# Patient Record
Sex: Male | Born: 1985 | Hispanic: Yes | Marital: Married | Smoking: Never smoker
Health system: Southern US, Community
[De-identification: ages and names within clinical notes are randomized; demographics above are authoritative.]

## PROBLEM LIST (undated history)

## (undated) HISTORY — PX: ANKLE SURGERY: SHX546

## (undated) HISTORY — PX: HAND SURGERY: SHX662

---
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
## 2007-09-28 ENCOUNTER — Emergency Department: Payer: Self-pay | Admitting: Emergency Medicine

## 2012-09-15 ENCOUNTER — Emergency Department: Payer: Self-pay | Admitting: Emergency Medicine

## 2012-09-15 LAB — URINALYSIS, COMPLETE
Blood: NEGATIVE
Glucose,UR: NEGATIVE mg/dL (ref 0–75)
Ketone: NEGATIVE
Leukocyte Esterase: NEGATIVE
Nitrite: NEGATIVE
Ph: 7 (ref 4.5–8.0)
RBC,UR: 1 /HPF (ref 0–5)
Specific Gravity: 1.016 (ref 1.003–1.030)
WBC UR: <1 /HPF (ref 0–5)

## 2012-09-15 LAB — PROTIME-INR: INR: 1

## 2012-09-15 LAB — LIPASE, BLOOD: Lipase: 95 U/L (ref 73–393)

## 2012-09-15 LAB — COMPREHENSIVE METABOLIC PANEL
Albumin: 3.8 g/dL (ref 3.4–5.0)
BUN: 17 mg/dL (ref 7–18)
Bilirubin,Total: 0.7 mg/dL (ref 0.2–1.0)
Chloride: 105 mmol/L (ref 98–107)
Co2: 30 mmol/L (ref 21–32)
Creatinine: 1.45 mg/dL — ABNORMAL HIGH (ref 0.60–1.30)
EGFR (African American): >60
Glucose: 95 mg/dL (ref 65–99)
Osmolality: 275 (ref 275–301)
SGOT(AST): 38 U/L — ABNORMAL HIGH (ref 15–37)
SGPT (ALT): 65 U/L (ref 12–78)
Sodium: 137 mmol/L (ref 136–145)
Total Protein: 7.8 g/dL (ref 6.4–8.2)

## 2012-09-15 LAB — CBC
HGB: 14.5 g/dL (ref 13.0–18.0)
MCV: 86 fL (ref 80–100)
RBC: 4.99 10*6/uL (ref 4.40–5.90)
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
## 2014-01-26 ENCOUNTER — Other Ambulatory Visit: Payer: Self-pay | Admitting: *Deleted

## 2014-01-26 ENCOUNTER — Encounter: Payer: Self-pay | Admitting: Podiatry

## 2014-01-26 ENCOUNTER — Ambulatory Visit (INDEPENDENT_AMBULATORY_CARE_PROVIDER_SITE_OTHER): Payer: Managed Care, Other (non HMO) | Admitting: Podiatry

## 2014-01-26 VITALS — BP 143/87 | HR 79 | Temp 98.4°F | Resp 16 | Ht 65.0 in | Wt 230.0 lb

## 2014-01-26 DIAGNOSIS — L259 Unspecified contact dermatitis, unspecified cause: Secondary | ICD-10-CM

## 2014-01-26 DIAGNOSIS — L089 Local infection of the skin and subcutaneous tissue, unspecified: Secondary | ICD-10-CM

## 2014-01-26 DIAGNOSIS — S90829A Blister (nonthermal), unspecified foot, initial encounter: Principal | ICD-10-CM

## 2014-01-26 DIAGNOSIS — S90426A Blister (nonthermal), unspecified lesser toe(s), initial encounter: Principal | ICD-10-CM

## 2014-01-26 DIAGNOSIS — L600 Ingrowing nail: Secondary | ICD-10-CM

## 2014-01-26 MED ORDER — PREDNISONE 10 MG PO KIT: 1.0000 | PACK | Freq: Four times a day (QID) | ORAL | Status: DC

## 2014-01-26 MED ORDER — CEPHALEXIN 500 MG PO CAPS: 500.0000 mg | ORAL_CAPSULE | Freq: Three times a day (TID) | ORAL | Status: DC

## 2014-01-26 NOTE — Progress Notes (Signed)
Subjective:     Patient ID: Casey Rogers, male   DOB: [birth year], 28 y.o.   MRN: [id number]  Foot Pain   patient states that he started to develop some discoloration on the plantar aspect of his foot about a week ago and it became painful. He then had and irritation on the dorsum of the foot and then on the lateral side of the left lower leg that are also painful when pressed. Does not remember specific injury or bite. Also complains that his big toenail is thick and painful on his left big toe   Review of Systems  All other systems reviewed and are negative.      Objective:   Physical Exam  Nursing note and vitals reviewed. Constitutional: He is oriented to person, place, and time.  Cardiovascular: Intact distal pulses.   Musculoskeletal: Normal range of motion.  Neurological: He is oriented to person, place, and time.  Skin: Skin is warm.   neurovascular status found to be intact with normal range of motion subtalar midtarsal joint and normal muscle strength. There is a small area on the plantar aspect of the left heel and the left forefoot around the third and fourth metatarsals on top of the left midfoot and on the left lateral leg that are red and splotchy appearance and painful. There is no skin and it has been several small blisters which are not present at this time. Also has a severely thickened left hallux nail which is dystrophic and he cannot take care of it himself and he can't remove one time without success     Assessment:     Unusual skin condition of the left foot and lower leg that it is not appear to be ulcerated and a severely damaged left hallux nail    Plan:     H&P and conditions discussed. We're moving him up to a 10 mg dose pack and starting him on an antibiotic cephalexin as a precautionary measure for his skin disease and I am sending for blood work. He is in a take approximately 3 days off work to let this rest and he wants to get the nail fixed as he  will be off of work. I did explain the risk of the procedure to him and he wants to have it done now and today I explained will be done permanently. I infiltrated 60 mg I can Marcaine mixture and then after sterile prep remove the nail and toe toe exposed matrix and applied phenol 3 applications 30 seconds followed by alcohol lavaged and sterile dressing. Gave instructions on Epson salt soaks and reappoint and if he is not improved on his skin condition by Monday with the medications he will see a dermatologist and contact us

## 2014-01-26 NOTE — Progress Notes (Signed)
   Subjective:    Patient ID: Casey Rogers, male    DOB: Aug 01, 1985, 28 y.o.   MRN: [id number]  HPI Comments: Pain in the bottom of the left foot, can hardly put any pressure on it, the left heel and the plantar foot under the 3rd and 4th toes. It started about a week ago and has got worse.  The top of foot has irritation or a bite mark on it and also a rash going up the side of the leg. The top of foot drained a little bit . The bottom of foot is almost bruised looking spottish and purple looking . Went to urgent care and they put me on a steroid and told me to come see a podiatrist right away.  Foot Pain Associated symptoms include a rash.      Review of Systems  Skin: Positive for rash.  All other systems reviewed and are negative.      Objective:   Physical Exam        Assessment & Plan:

## 2014-01-27 LAB — CBC WITH DIFFERENTIAL/PLATELET
BASOS ABS: 0.1 10*3/uL (ref 0.0–0.2)
BASOS: 1 %
EOS ABS: 0.1 10*3/uL (ref 0.0–0.4)
Eos: 1 %
HCT: 44.4 % (ref 37.5–51.0)
Hemoglobin: 15.3 g/dL (ref 12.6–17.7)
IMMATURE GRANS (ABS): 0 10*3/uL (ref 0.0–0.1)
IMMATURE GRANULOCYTES: 0 %
Lymphocytes Absolute: 3.2 10*3/uL — ABNORMAL HIGH (ref 0.7–3.1)
Lymphs: 32 %
MCH: 29.7 pg (ref 26.6–33.0)
MCHC: 34.5 g/dL (ref 31.5–35.7)
MCV: 86 fL (ref 79–97)
MONOS ABS: 0.9 10*3/uL (ref 0.1–0.9)
Monocytes: 9 %
NEUTROS PCT: 57 %
Neutrophils Absolute: 5.6 10*3/uL (ref 1.4–7.0)
RBC: 5.16 x10E6/uL (ref 4.14–5.80)
RDW: 13.2 % (ref 12.3–15.4)
WBC: 9.9 10*3/uL (ref 3.4–10.8)

## 2014-01-30 LAB — BMP8+EGFR
BUN / CREAT RATIO: 19 (ref 8–19)
BUN: 16 mg/dL (ref 6–20)
CO2: 26 mmol/L (ref 18–29)
Calcium: 9.3 mg/dL (ref 8.7–10.2)
Chloride: 101 mmol/L (ref 97–108)
Creatinine, Ser: 0.85 mg/dL (ref 0.76–1.27)
GFR calc Af Amer: 139 mL/min/{1.73_m2} (ref >59)
GFR calc non Af Amer: 120 mL/min/{1.73_m2} (ref >59)
Glucose: 87 mg/dL (ref 65–99)
Potassium: 3.8 mmol/L (ref 3.5–5.2)
SODIUM: 142 mmol/L (ref 134–144)

## 2014-01-30 LAB — SPECIMEN STATUS REPORT

## 2014-07-19 ENCOUNTER — Emergency Department: Payer: Self-pay | Admitting: Emergency Medicine

## 2015-08-19 IMAGING — CR DG CHEST 2V
1 series · 2 of 2 positions shown · non-contrast
Comparison: None.

CLINICAL DATA: Acute onset of shortness of breath. Nausea and left
upper back pressure. Initial encounter.

EXAM:
CHEST  2 VIEW

[Series 1: dxr chest pa (or ap) and lateral · 0.14mm/px · 2 of 2 slices shown]
[im 1/2]
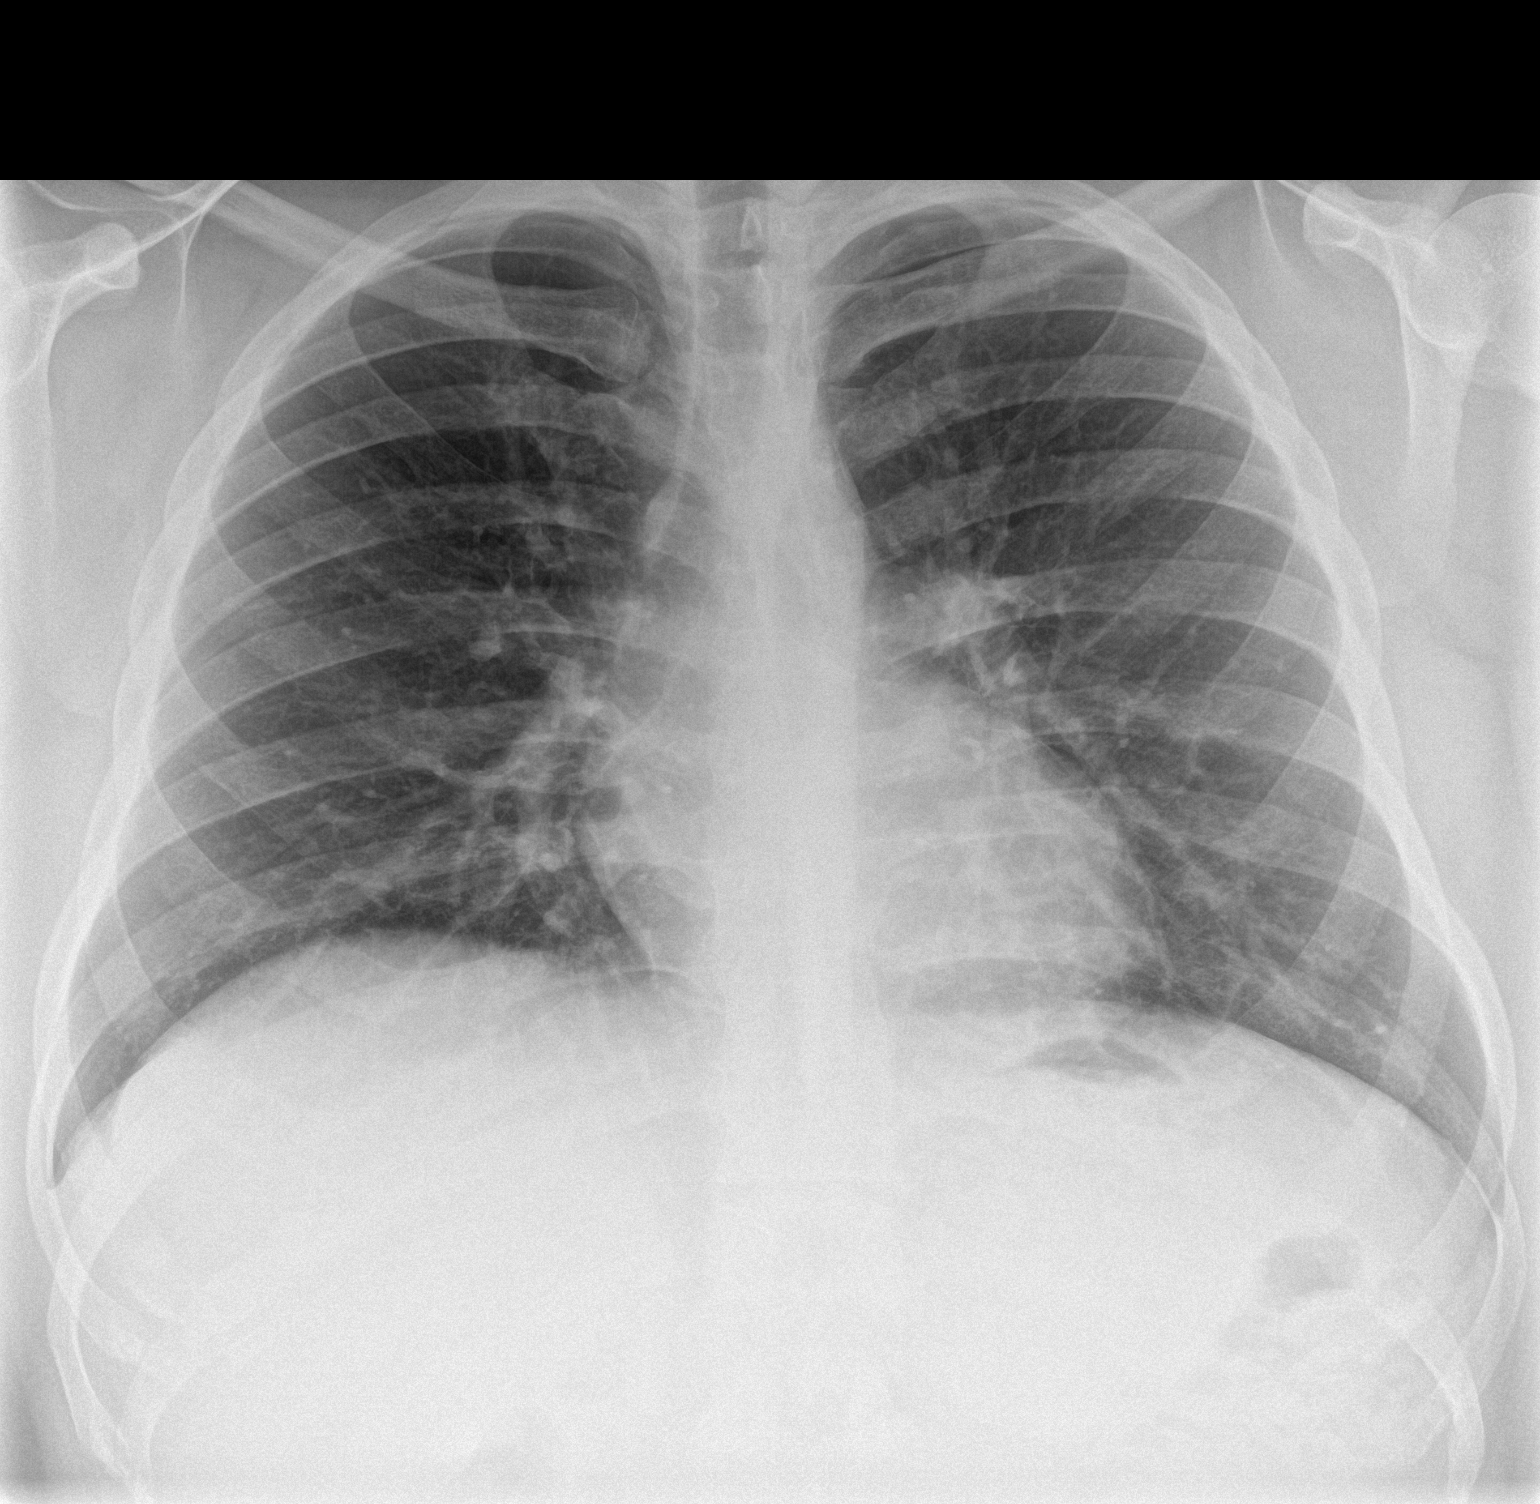
[im 2/2]
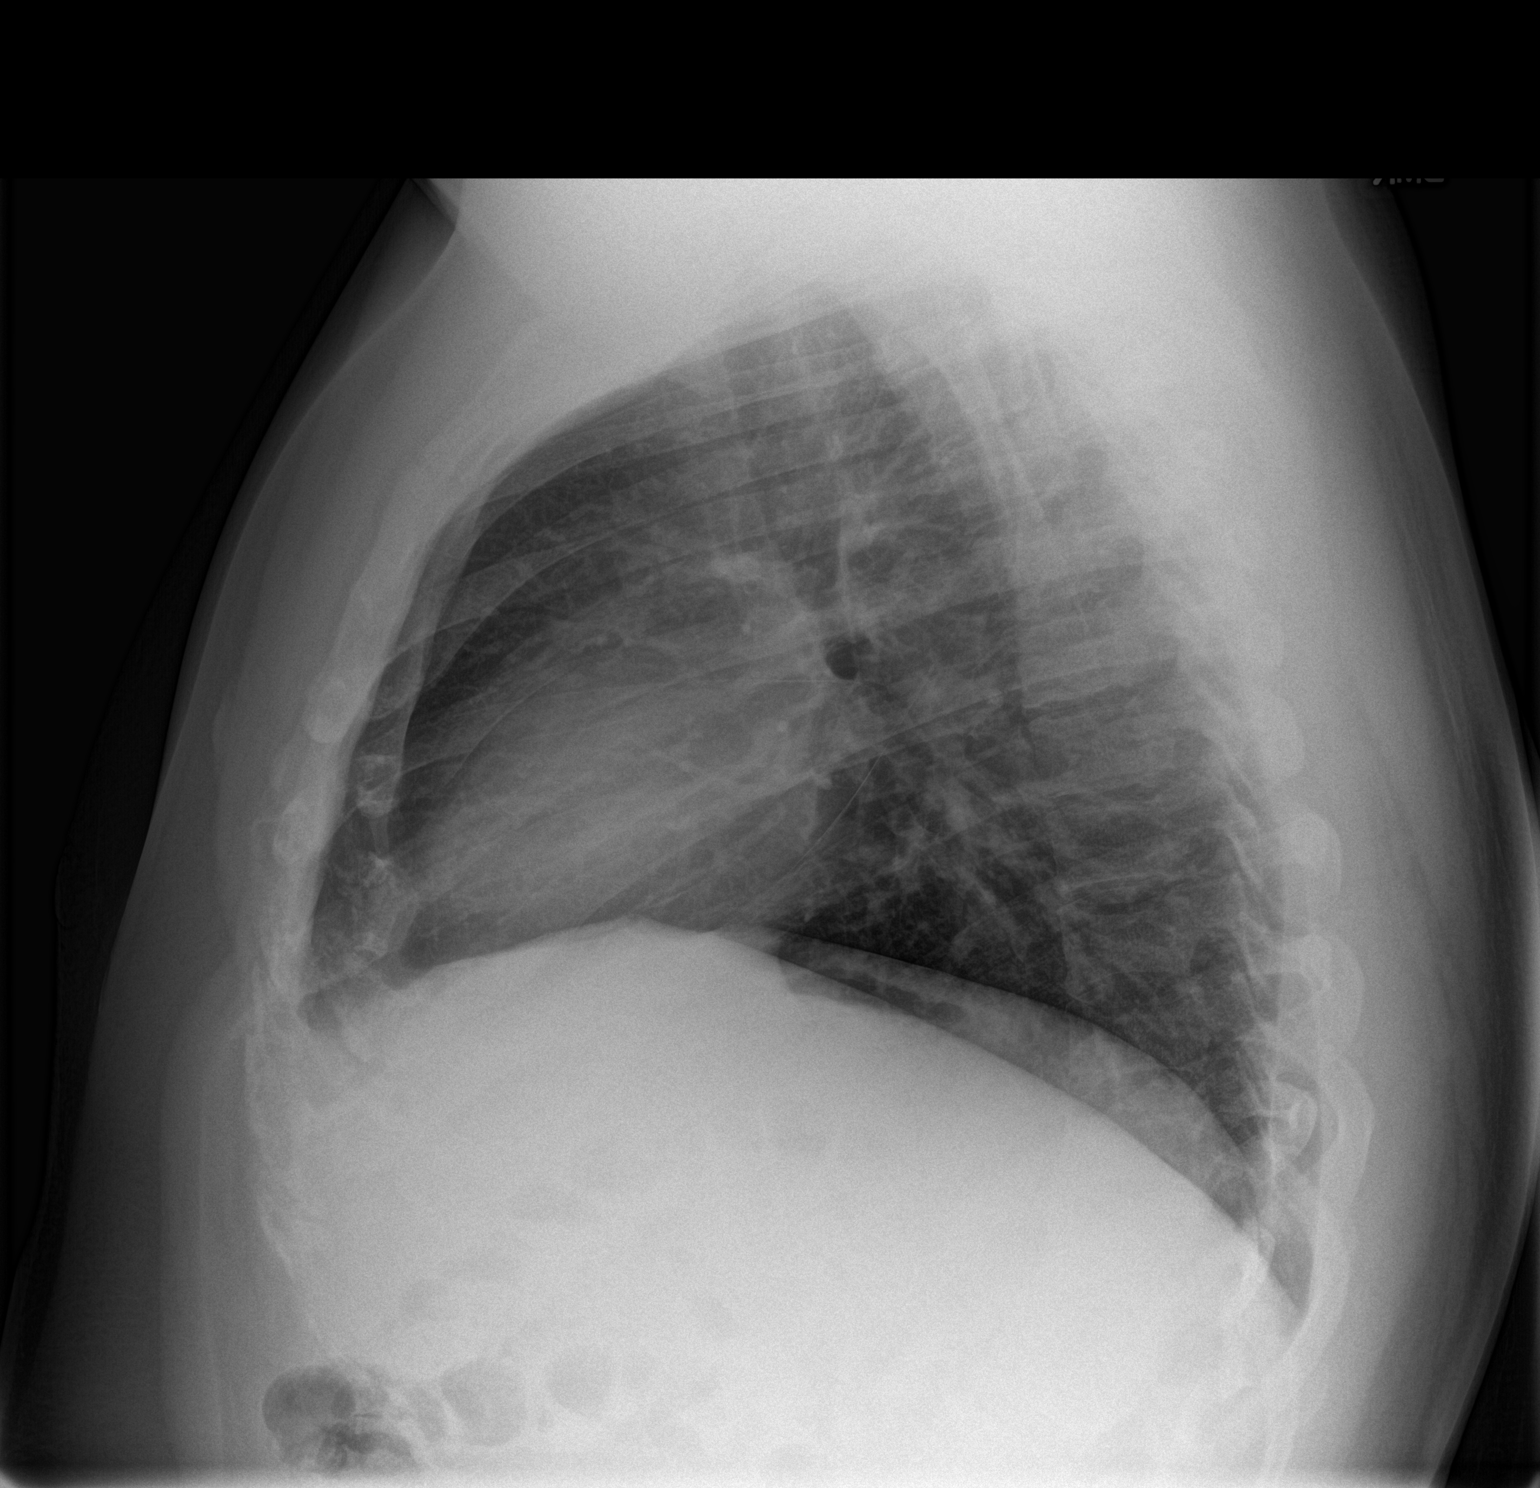

[2 of 2 positions shown; findings below may reference images not displayed]

FINDINGS: The lungs are well-aerated and clear. There is no evidence of focal
opacification, pleural effusion or pneumothorax.

The heart is normal in size; the mediastinal contour is within
normal limits. No acute osseous abnormalities are seen.
IMPRESSION: No acute cardiopulmonary process seen.

## 2019-02-07 ENCOUNTER — Ambulatory Visit (INDEPENDENT_AMBULATORY_CARE_PROVIDER_SITE_OTHER): Payer: PRIVATE HEALTH INSURANCE | Admitting: Physician Assistant

## 2019-02-07 ENCOUNTER — Encounter: Payer: Self-pay | Admitting: Physician Assistant

## 2019-02-07 ENCOUNTER — Other Ambulatory Visit: Payer: Self-pay | Admitting: Physician Assistant

## 2019-02-07 ENCOUNTER — Other Ambulatory Visit: Payer: Self-pay

## 2019-02-07 VITALS — BP 120/83 | HR 82 | Temp 97.3°F | Ht 65.0 in | Wt 206.0 lb

## 2019-02-07 DIAGNOSIS — E1165 Type 2 diabetes mellitus with hyperglycemia: Secondary | ICD-10-CM

## 2019-02-07 DIAGNOSIS — Z23 Encounter for immunization: Secondary | ICD-10-CM

## 2019-02-07 DIAGNOSIS — R634 Abnormal weight loss: Secondary | ICD-10-CM

## 2019-02-07 DIAGNOSIS — R631 Polydipsia: Secondary | ICD-10-CM | POA: Diagnosis not present

## 2019-02-07 DIAGNOSIS — Z6834 Body mass index (BMI) 34.0-34.9, adult: Secondary | ICD-10-CM

## 2019-02-07 DIAGNOSIS — Z114 Encounter for screening for human immunodeficiency virus [HIV]: Secondary | ICD-10-CM

## 2019-02-07 DIAGNOSIS — E6609 Other obesity due to excess calories: Secondary | ICD-10-CM | POA: Diagnosis not present

## 2019-02-07 NOTE — Progress Notes (Signed)
Patient: Casey Rogers, Male    DOB: [birth year], 33 y.o.   MRN: [id number] Visit Date: 02/07/2019  Today's Provider: Trinna Post, PA-C   Chief Complaint  Patient presents with  . Establish Care   Subjective:     Annual physical exam Casey Rogers is a 33 y.o. male who presents today for health maintenance and complete physical. He feels well. Pt reports having an unexplained  50 pound weight lost in two months time.  He reports it has stabilized as of now.  He also reports extreme thirst at the time of his weight loss.   He reports note exercising. He reports he is sleeping well.  Living in [place], wife of 32 years, son 50. Works as Land at [employer]. Never used drugs. Smoked remotely. Drinks alcohol once monthly.  Reports he experienced weight loss of 50 lbs over two months without doing anything different. Reports increased thirst. Reports increased urination. No night sweats, denies SOB. Reports weight 2 months ago was 250. Reports his Dad has prediabetes, brother who is in 80s with prediabetes. He is concerned because has been having abdominal pain as well and his grandmother had similar symptoms before being diagnosed with cancer.   Obesity:   BMI Readings from Last 5 Encounters:  02/07/19 34.28 kg/m  01/26/14 38.27 kg/m   Drinking 3 cans of cokes per day, drinks tropicana a cup in the morning, no sweet tea, no milkshakes Frequent rice, pasta, sandwhiches, burgers, buns, pizza Eats out three times per week: fast food burger king, subway, taco bell, [employer] cheese steak, [employer].  -----------------------------------------------------------------   Review of Systems  Constitutional: Positive for unexpected weight change (Pt reports he lost 50lbs in two months). Negative for activity change, appetite change, chills, diaphoresis, fatigue and fever.  HENT: Negative.   Eyes: Negative.   Respiratory: Negative.   Cardiovascular: Negative.    Gastrointestinal: Negative.   Endocrine: Positive for polydipsia. Negative for cold intolerance, heat intolerance, polyphagia and polyuria.  Genitourinary: Negative.   Musculoskeletal: Positive for back pain and myalgias. Negative for arthralgias, gait problem, joint swelling, neck pain and neck stiffness.  Skin: Negative.   Allergic/Immunologic: Negative for immunocompromised state.  Neurological: Negative.   Hematological: Negative.   Psychiatric/Behavioral: Negative.     Social History      He  reports that he has never smoked. He has never used smokeless tobacco. He reports current alcohol use. He reports that he does not use drugs.       Social History   Socioeconomic History  . Marital status: Married    Spouse name: Not on file  . Number of children: Not on file  . Years of education: Not on file  . Highest education level: Not on file  Occupational History  . Not on file  Social Needs  . Financial resource strain: Not on file  . Food insecurity    Worry: Not on file    Inability: Not on file  . Transportation needs    Medical: Not on file    Non-medical: Not on file  Tobacco Use  . Smoking status: Never Smoker  . Smokeless tobacco: Never Used  Substance and Sexual Activity  . Alcohol use: Yes  . Drug use: Never  . Sexual activity: Not on file  Lifestyle  . Physical activity    Days per week: Not on file    Minutes per session: Not on file  . Stress: Not on  file  Relationships  . Social Herbalist on phone: Not on file    Gets together: Not on file    Attends religious service: Not on file    Active member of club or organization: Not on file    Attends meetings of clubs or organizations: Not on file    Relationship status: Not on file  Other Topics Concern  . Not on file  Social History Narrative  . Not on file    No past medical history on file.   There are no active problems to display for this patient.   Past Surgical History:   Procedure Laterality Date  . ANKLE SURGERY    . HAND SURGERY      Family History        Family Status  Relation Name Status  . Mother  Alive  . Father  Alive  . Brother  Alive  . MGM  Deceased  . Brother  Alive        His family history includes Colon cancer in his maternal grandmother; Diabetes in his brother; Healthy in his mother; Hyperlipidemia in his brother.      No Known Allergies   Current Outpatient Medications:  .  cephALEXin (KEFLEX) 500 MG capsule, Take 1 capsule (500 mg total) by mouth 3 (three) times daily. (Patient not taking: Reported on 02/07/2019), Disp: 30 capsule, Rfl: 0 .  methylPREDNIsolone (MEDROL DOSPACK) 4 MG tablet, , Disp: , Rfl:  .  PredniSONE 10 MG KIT, Take 1 kit (10 mg total) by mouth taper from 4 doses each day to 1 dose and stop. (Patient not taking: Reported on 02/07/2019), Disp: 1 kit, Rfl: 0   Patient Care Team: Paulene Floor as PCP - General (Physician Assistant)    Objective:    Vitals: BP 120/83 (BP Location: Left Arm, Patient Position: Sitting, Cuff Size: Large)   Pulse 82   Temp (!) 97.3 F (36.3 C) (Temporal)   Ht 5' 5"  (1.651 m)   Wt 206 lb (93.4 kg)   BMI 34.28 kg/m    Vitals:   02/07/19 1511  BP: 120/83  Pulse: 82  Temp: (!) 97.3 F (36.3 C)  TempSrc: Temporal  Weight: 206 lb (93.4 kg)  Height: 5' 5"  (1.651 m)     Physical Exam Constitutional:      Appearance: Normal appearance. He is obese.  Cardiovascular:     Rate and Rhythm: Normal rate and regular rhythm.     Heart sounds: Normal heart sounds.  Pulmonary:     Effort: Pulmonary effort is normal.     Breath sounds: Normal breath sounds.  Abdominal:     General: Bowel sounds are normal.     Palpations: Abdomen is soft.  Skin:    General: Skin is warm and dry.  Neurological:     Mental Status: He is alert and oriented to person, place, and time. Mental status is at baseline.  Psychiatric:        Mood and Affect: Mood normal.        Behavior:  Behavior normal.      Depression Screen PHQ 2/9 Scores 02/07/2019  PHQ - 2 Score 0  PHQ- 9 Score 0       Assessment & Plan:     Routine Health Maintenance and Physical Exam  Exercise Activities and Dietary recommendations Goals   None      There is no immunization history on file for this patient.  Health Maintenance  Topic Date Due  . HIV Screening  08/18/2002  . TETANUS/TDAP  08/18/2006  . INFLUENZA VACCINE  12/03/2018     Discussed health benefits of physical activity, and encouraged him to engage in regular exercise appropriate for his age and condition.    1. Class 1 obesity due to excess calories without serious comorbidity with body mass index (BMI) of 34.0 to 34.9 in adult  - Comprehensive Metabolic Panel (CMET) - CBC with Differential - Lipid Profile - HgB A1c  2. Uncontrolled type 2 diabetes mellitus with hyperglycemia ([place])   3. Polydipsia  - HgB A1c  4. Weight loss  Suspect he has diabetes. Discussed in depth process related to weight loss and uncontrolled diabetes.  - Comprehensive Metabolic Panel (CMET) - CBC with Differential - TSH - HgB A1c  5. Encounter for screening for HIV  - HIV antibody (with reflex)  6. Need for influenza vaccination  - Flu Vaccine QUAD 6+ mos PF IM (Fluarix Quad PF)  I have spent 45 minutes with this patient, >50% of which was spent on counseling and coordination of care.  The entirety of the information documented in the History of Present Illness, Review of Systems and Physical Exam were personally obtained by me. Portions of this information were initially documented by Ashley Royalty, CMA and reviewed by me for thoroughness and accuracy.   --------------------------------------------------------------------    Trinna Post, PA-C  [facility]

## 2019-02-07 NOTE — Patient Instructions (Signed)

## 2019-02-08 ENCOUNTER — Telehealth: Payer: Self-pay

## 2019-02-08 DIAGNOSIS — E6609 Other obesity due to excess calories: Secondary | ICD-10-CM | POA: Insufficient documentation

## 2019-02-08 DIAGNOSIS — E1165 Type 2 diabetes mellitus with hyperglycemia: Secondary | ICD-10-CM | POA: Insufficient documentation

## 2019-02-08 DIAGNOSIS — Z6834 Body mass index (BMI) 34.0-34.9, adult: Secondary | ICD-10-CM | POA: Insufficient documentation

## 2019-02-08 LAB — CBC WITH DIFFERENTIAL/PLATELET
Basophils Absolute: 0 10*3/uL (ref 0.0–0.2)
Basos: 1 %
EOS (ABSOLUTE): 0.3 10*3/uL (ref 0.0–0.4)
Eos: 4 %
Hematocrit: 48.4 % (ref 37.5–51.0)
Hemoglobin: 16.2 g/dL (ref 13.0–17.7)
Immature Grans (Abs): 0 10*3/uL (ref 0.0–0.1)
Immature Granulocytes: 0 %
Lymphocytes Absolute: 2.1 10*3/uL (ref 0.7–3.1)
Lymphs: 33 %
MCH: 29.8 pg (ref 26.6–33.0)
MCHC: 33.5 g/dL (ref 31.5–35.7)
MCV: 89 fL (ref 79–97)
Monocytes Absolute: 0.3 10*3/uL (ref 0.1–0.9)
Monocytes: 5 %
Neutrophils Absolute: 3.6 10*3/uL (ref 1.4–7.0)
Neutrophils: 57 %
Platelets: 226 10*3/uL (ref 150–450)
RBC: 5.44 x10E6/uL (ref 4.14–5.80)
RDW: 12.5 % (ref 11.6–15.4)
WBC: 6.3 10*3/uL (ref 3.4–10.8)

## 2019-02-08 LAB — COMPREHENSIVE METABOLIC PANEL
ALT: 62 IU/L — ABNORMAL HIGH (ref 0–44)
AST: 27 IU/L (ref 0–40)
Albumin/Globulin Ratio: 1.3 (ref 1.2–2.2)
Albumin: 4.6 g/dL (ref 4.0–5.0)
Alkaline Phosphatase: 115 IU/L (ref 39–117)
BUN/Creatinine Ratio: 13 (ref 9–20)
BUN: 12 mg/dL (ref 6–20)
Bilirubin Total: 0.6 mg/dL (ref 0.0–1.2)
CO2: 23 mmol/L (ref 20–29)
Calcium: 9.6 mg/dL (ref 8.7–10.2)
Chloride: 97 mmol/L (ref 96–106)
Creatinine, Ser: 0.93 mg/dL (ref 0.76–1.27)
GFR calc Af Amer: 126 mL/min/{1.73_m2} (ref >59)
GFR calc non Af Amer: 109 mL/min/{1.73_m2} (ref >59)
Globulin, Total: 3.6 g/dL (ref 1.5–4.5)
Glucose: 368 mg/dL — ABNORMAL HIGH (ref 65–99)
Potassium: 4 mmol/L (ref 3.5–5.2)
Sodium: 135 mmol/L (ref 134–144)
Total Protein: 8.2 g/dL (ref 6.0–8.5)

## 2019-02-08 LAB — LIPID PANEL
Chol/HDL Ratio: 5.5 ratio — ABNORMAL HIGH (ref 0.0–5.0)
Cholesterol, Total: 186 mg/dL (ref 100–199)
HDL: 34 mg/dL — ABNORMAL LOW (ref >39)
LDL Chol Calc (NIH): 97 mg/dL (ref 0–99)
Triglycerides: 328 mg/dL — ABNORMAL HIGH (ref 0–149)
VLDL Cholesterol Cal: 55 mg/dL — ABNORMAL HIGH (ref 5–40)

## 2019-02-08 LAB — HIV ANTIBODY (ROUTINE TESTING W REFLEX): HIV Screen 4th Generation wRfx: NONREACTIVE

## 2019-02-08 LAB — HEMOGLOBIN A1C
Est. average glucose Bld gHb Est-mCnc: 352 mg/dL
Hgb A1c MFr Bld: 13.9 % — ABNORMAL HIGH (ref 4.8–5.6)

## 2019-02-08 LAB — TSH: TSH: 1.75 u[IU]/mL (ref 0.450–4.500)

## 2019-02-08 NOTE — Telephone Encounter (Signed)
-----   Message from Trinna Post, [place] sent at 02/08/2019  2:00 PM EDT ----- His A1c is 13.9% which is consistent with the severely uncontrolled diabetes that we discussed in the clinic. His cholesterol is elevated. One of his liver enzymes is also elevated, probably related to his weight. His thyroid, blood counts and HIV tests are normal. Please schedule him a 40 minute follow up so we can discuss diabetes and start treatment.

## 2019-02-08 NOTE — Telephone Encounter (Signed)
[id number] 

## 2019-02-08 NOTE — Telephone Encounter (Signed)
Advised and appointment made 

## 2019-02-14 ENCOUNTER — Encounter: Payer: Self-pay | Admitting: Physician Assistant

## 2019-02-14 ENCOUNTER — Other Ambulatory Visit: Payer: Self-pay

## 2019-02-14 ENCOUNTER — Ambulatory Visit: Payer: PRIVATE HEALTH INSURANCE | Admitting: Physician Assistant

## 2019-02-14 VITALS — BP 110/74 | HR 79 | Temp 97.5°F | Resp 16 | Ht 65.0 in | Wt 207.0 lb

## 2019-02-14 DIAGNOSIS — Z6834 Body mass index (BMI) 34.0-34.9, adult: Secondary | ICD-10-CM

## 2019-02-14 DIAGNOSIS — E1169 Type 2 diabetes mellitus with other specified complication: Secondary | ICD-10-CM

## 2019-02-14 DIAGNOSIS — E785 Hyperlipidemia, unspecified: Secondary | ICD-10-CM

## 2019-02-14 DIAGNOSIS — E1165 Type 2 diabetes mellitus with hyperglycemia: Secondary | ICD-10-CM | POA: Diagnosis not present

## 2019-02-14 DIAGNOSIS — E6609 Other obesity due to excess calories: Secondary | ICD-10-CM | POA: Diagnosis not present

## 2019-02-14 LAB — POCT UA - MICROALBUMIN: Microalbumin Ur, POC: 20 mg/L

## 2019-02-14 MED ORDER — METFORMIN HCL ER 500 MG PO TB24: ORAL_TABLET | ORAL | 0 refills | Status: AC

## 2019-02-14 NOTE — Progress Notes (Signed)
Patient: Casey Rogers Male    DOB: March 24, 1986   33 y.o.   MRN: [id number] Visit Date: 02/14/2019  Today's Provider: Trey Sailors, PA-C   Chief Complaint  Patient presents with  . Follow-up   Subjective:     HPI   Patient is here to discuss his lab results from 02/07/2019. Newly diagnosed with diabetes at initial visit.   Lab Results  Component Value Date   HGBA1C 13.9 (H) 02/07/2019   HLD  Lipid Panel     Component Value Date/Time   CHOL 186 02/07/2019 1553   TRIG 328 (H) 02/07/2019 1553   HDL 34 (L) 02/07/2019 1553   CHOLHDL 5.5 (H) 02/07/2019 1553   LDLCALC 97 02/07/2019 1553   LABVLDL 55 (H) 02/07/2019 1553     Wt Readings from Last 3 Encounters:  02/14/19 207 lb (93.9 kg)  02/07/19 206 lb (93.4 kg)  01/26/14 230 lb (104.3 kg)     No Known Allergies   Current Outpatient Medications:  .  metFORMIN (GLUCOPHAGE XR) 500 MG 24 hr tablet, Take 1 pill in the morning x 1 wk. Take 1 pill 2x daily for 1 wk. Take 2 pills in the morning and 1 at night x 1 wk. Then take 2 pills twice daily onward., Disp: 180 tablet, Rfl: 0  Review of Systems  Constitutional: Negative for appetite change, chills and fever.  Respiratory: Negative for chest tightness, shortness of breath and wheezing.   Cardiovascular: Negative for chest pain and palpitations.  Gastrointestinal: Negative for abdominal pain, nausea and vomiting.    Social History   Tobacco Use  . Smoking status: Never Smoker  . Smokeless tobacco: Never Used  Substance Use Topics  . Alcohol use: Yes      Objective:   BP 110/74 (BP Location: Left Arm, Patient Position: Sitting, Cuff Size: Large)   Pulse 79   Temp (!) 97.5 F (36.4 C) (Other (Comment))   Resp 16   Ht 5\' 5"  (1.651 m)   Wt 207 lb (93.9 kg)   SpO2 95%   BMI 34.45 kg/m  Vitals:   02/14/19 0823  BP: 110/74  Pulse: 79  Resp: 16  Temp: (!) 97.5 F (36.4 C)  TempSrc: Other (Comment)  SpO2: 95%  Weight: 207 lb (93.9 kg)    Height: 5\' 5"  (1.651 m)  Body mass index is 34.45 kg/m.   Physical Exam Constitutional:      Appearance: Normal appearance. He is obese.  Cardiovascular:     Rate and Rhythm: Normal rate.     Pulses: Normal pulses.  Skin:    General: Skin is warm and dry.  Neurological:     Mental Status: He is alert and oriented to person, place, and time. Mental status is at baseline.  Psychiatric:        Mood and Affect: Mood normal.        Behavior: Behavior normal.      Results for orders placed or performed in visit on 02/14/19  POCT UA - Microalbumin  Result Value Ref Range   Microalbumin Ur, POC 20 mg/L       Assessment & Plan    1. Uncontrolled type 2 diabetes mellitus with hyperglycemia (HCC)  Refer to nurse educator for use on meter and lifestyle interventions. Rx for meter given. Needs eye appointment. Pneumovax at next visit.   - Ambulatory referral to Ophthalmology - POCT UA - Microalbumin - Ambulatory referral to Chronic Care Management  Services - metFORMIN (GLUCOPHAGE XR) 500 MG 24 hr tablet; Take 1 pill in the morning x 1 wk. Take 1 pill 2x daily for 1 wk. Take 2 pills in the morning and 1 at night x 1 wk. Then take 2 pills twice daily onward.  Dispense: 180 tablet; Refill: 0  2. Hyperlipidemia associated with type 2 diabetes mellitus (HCC)  Elevated.  3. Class 1 obesity due to excess calories without serious comorbidity with body mass index (BMI) of 34.0 to 34.9 in adult  CCM referral.  The entirety of the information documented in the History of Present Illness, Review of Systems and Physical Exam were personally obtained by me. Portions of this information were initially documented by April M. Sabra Heck, CMA and reviewed by me for thoroughness and accuracy.   F/u 1 month DM II      Trinna Post, PA-C  [facility]

## 2019-02-14 NOTE — Patient Instructions (Signed)
Diabetes Mellitus and Exercise Exercising regularly is important for your overall health, especially when you have diabetes (diabetes mellitus). Exercising is not only about losing weight. It has many other health benefits, such as increasing muscle strength and bone density and reducing body fat and stress. This leads to improved fitness, flexibility, and endurance, all of which result in better overall health. Exercise has additional benefits for people with diabetes, including:  Reducing appetite.  Helping to lower and control blood glucose.  Lowering blood pressure.  Helping to control amounts of fatty substances (lipids) in the blood, such as cholesterol and triglycerides.  Helping the body to respond better to insulin (improving insulin sensitivity).  Reducing how much insulin the body needs.  Decreasing the risk for heart disease by: ? Lowering cholesterol and triglyceride levels. ? Increasing the levels of good cholesterol. ? Lowering blood glucose levels. What is my activity plan? Your health care provider or certified diabetes educator can help you make a plan for the type and frequency of exercise (activity plan) that works for you. Make sure that you:  Do at least 150 minutes of moderate-intensity or vigorous-intensity exercise each week. This could be brisk walking, biking, or water aerobics. ? Do stretching and strength exercises, such as yoga or weightlifting, at least 2 times a week. ? Spread out your activity over at least 3 days of the week.  Get some form of physical activity every day. ? Do not go more than 2 days in a row without some kind of physical activity. ? Avoid being inactive for more than 30 minutes at a time. Take frequent breaks to walk or stretch.  Choose a type of exercise or activity that you enjoy, and set realistic goals.  Start slowly, and gradually increase the intensity of your exercise over time. What do I need to know about managing my  diabetes?   Check your blood glucose before and after exercising. ? If your blood glucose is 240 mg/dL (13.3 mmol/L) or higher before you exercise, check your urine for ketones. If you have ketones in your urine, do not exercise until your blood glucose returns to normal. ? If your blood glucose is 100 mg/dL (5.6 mmol/L) or lower, eat a snack containing 15-20 grams of carbohydrate. Check your blood glucose 15 minutes after the snack to make sure that your level is above 100 mg/dL (5.6 mmol/L) before you start your exercise.  Know the symptoms of low blood glucose (hypoglycemia) and how to treat it. Your risk for hypoglycemia increases during and after exercise. Common symptoms of hypoglycemia can include: ? Hunger. ? Anxiety. ? Sweating and feeling clammy. ? Confusion. ? Dizziness or feeling light-headed. ? Increased heart rate or palpitations. ? Blurry vision. ? Tingling or numbness around the mouth, lips, or tongue. ? Tremors or shakes. ? Irritability.  Keep a rapid-acting carbohydrate snack available before, during, and after exercise to help prevent or treat hypoglycemia.  Avoid injecting insulin into areas of the body that are going to be exercised. For example, avoid injecting insulin into: ? The arms, when playing tennis. ? The legs, when jogging.  Keep records of your exercise habits. Doing this can help you and your health care provider adjust your diabetes management plan as needed. Write down: ? Food that you eat before and after you exercise. ? Blood glucose levels before and after you exercise. ? The type and amount of exercise you have done. ? When your insulin is expected to peak, if you use   insulin. Avoid exercising at times when your insulin is peaking.  When you start a new exercise or activity, work with your health care provider to make sure the activity is safe for you, and to adjust your insulin, medicines, or food intake as needed.  Drink plenty of water while  you exercise to prevent dehydration or heat stroke. Drink enough fluid to keep your urine clear or pale yellow. Summary  Exercising regularly is important for your overall health, especially when you have diabetes (diabetes mellitus).  Exercising has many health benefits, such as increasing muscle strength and bone density and reducing body fat and stress.  Your health care provider or certified diabetes educator can help you make a plan for the type and frequency of exercise (activity plan) that works for you.  When you start a new exercise or activity, work with your health care provider to make sure the activity is safe for you, and to adjust your insulin, medicines, or food intake as needed. This information is not intended to replace advice given to you by your health care provider. Make sure you discuss any questions you have with your health care provider. Document Released: 07/11/2003 Document Revised: 11/12/2016 Document Reviewed: 09/30/2015 Elsevier Patient Education  2020 [employer]  

## 2019-02-16 ENCOUNTER — Ambulatory Visit: Payer: Self-pay | Admitting: Pharmacist

## 2019-02-16 DIAGNOSIS — E1165 Type 2 diabetes mellitus with hyperglycemia: Secondary | ICD-10-CM

## 2019-02-16 NOTE — Patient Instructions (Signed)
Casey Rogers was given information about Care Management services today including:  1. Care Management services includes personalized support from designated clinical staff supervised by his physician, including individualized plan of care and coordination with other care providers 2. 24/7 contact phone numbers for assistance for urgent and routine care needs. 3. The patient may stop case management services at any time by phone call to the office staff.  Patient agreed to services and verbal consent obtained.

## 2019-02-16 NOTE — Chronic Care Management (AMB) (Signed)
  Care Management   Note  02/16/2019 Name: Casey Rogers MRN: [id number] DOB: October 22, 1985  [name] is a 33 y.o. year old male who sees Trinna Post, [place] for primary care. Adriana asked the CCM team to consult the patient for assistance with chronic disease management related to new diagnosis of DM Referral was placed 02/14/19. Telephone outreach to patient today to introduce care management services.   Plan: [name] agreed that care management services would be helpful to him/her and verbal consent was obtained. I have scheduled an initial visit for 02/23/19   [name] was given information about Care Management services today including:  1. Case Management services includes personalized support from designated clinical staff supervised by his physician, including individualized plan of care and coordination with other care providers 2. 24/7 contact phone numbers for assistance for urgent and routine care needs. 3. The patient may stop case management services at any time by phone call to the office staff.  Patient agreed to services and verbal consent obtained.    [name], PharmD Clinical Pharmacist [place] [phone]

## 2019-02-23 ENCOUNTER — Telehealth: Payer: Self-pay

## 2019-03-17 ENCOUNTER — Encounter: Payer: Self-pay | Admitting: Physician Assistant

## 2019-03-17 ENCOUNTER — Other Ambulatory Visit: Payer: Self-pay

## 2019-03-17 ENCOUNTER — Ambulatory Visit: Payer: PRIVATE HEALTH INSURANCE | Admitting: Physician Assistant

## 2019-03-17 VITALS — BP 122/82 | HR 74 | Temp 97.1°F | Resp 16 | Wt 212.4 lb

## 2019-03-17 DIAGNOSIS — E1165 Type 2 diabetes mellitus with hyperglycemia: Secondary | ICD-10-CM | POA: Diagnosis not present

## 2019-03-17 DIAGNOSIS — Z23 Encounter for immunization: Secondary | ICD-10-CM | POA: Diagnosis not present

## 2019-03-17 LAB — POCT GLYCOSYLATED HEMOGLOBIN (HGB A1C): Hemoglobin A1C: 11.1 % — AB (ref 4.0–5.6)

## 2019-03-17 NOTE — Patient Instructions (Signed)
Diabetes Mellitus and Exercise Exercising regularly is important for your overall health, especially when you have diabetes (diabetes mellitus). Exercising is not only about losing weight. It has many other health benefits, such as increasing muscle strength and bone density and reducing body fat and stress. This leads to improved fitness, flexibility, and endurance, all of which result in better overall health. Exercise has additional benefits for people with diabetes, including:  Reducing appetite.  Helping to lower and control blood glucose.  Lowering blood pressure.  Helping to control amounts of fatty substances (lipids) in the blood, such as cholesterol and triglycerides.  Helping the body to respond better to insulin (improving insulin sensitivity).  Reducing how much insulin the body needs.  Decreasing the risk for heart disease by: ? Lowering cholesterol and triglyceride levels. ? Increasing the levels of good cholesterol. ? Lowering blood glucose levels. What is my activity plan? Your health care provider or certified diabetes educator can help you make a plan for the type and frequency of exercise (activity plan) that works for you. Make sure that you:  Do at least 150 minutes of moderate-intensity or vigorous-intensity exercise each week. This could be brisk walking, biking, or water aerobics. ? Do stretching and strength exercises, such as yoga or weightlifting, at least 2 times a week. ? Spread out your activity over at least 3 days of the week.  Get some form of physical activity every day. ? Do not go more than 2 days in a row without some kind of physical activity. ? Avoid being inactive for more than 30 minutes at a time. Take frequent breaks to walk or stretch.  Choose a type of exercise or activity that you enjoy, and set realistic goals.  Start slowly, and gradually increase the intensity of your exercise over time. What do I need to know about managing my  diabetes?   Check your blood glucose before and after exercising. ? If your blood glucose is 240 mg/dL (13.3 mmol/L) or higher before you exercise, check your urine for ketones. If you have ketones in your urine, do not exercise until your blood glucose returns to normal. ? If your blood glucose is 100 mg/dL (5.6 mmol/L) or lower, eat a snack containing 15-20 grams of carbohydrate. Check your blood glucose 15 minutes after the snack to make sure that your level is above 100 mg/dL (5.6 mmol/L) before you start your exercise.  Know the symptoms of low blood glucose (hypoglycemia) and how to treat it. Your risk for hypoglycemia increases during and after exercise. Common symptoms of hypoglycemia can include: ? Hunger. ? Anxiety. ? Sweating and feeling clammy. ? Confusion. ? Dizziness or feeling light-headed. ? Increased heart rate or palpitations. ? Blurry vision. ? Tingling or numbness around the mouth, lips, or tongue. ? Tremors or shakes. ? Irritability.  Keep a rapid-acting carbohydrate snack available before, during, and after exercise to help prevent or treat hypoglycemia.  Avoid injecting insulin into areas of the body that are going to be exercised. For example, avoid injecting insulin into: ? The arms, when playing tennis. ? The legs, when jogging.  Keep records of your exercise habits. Doing this can help you and your health care provider adjust your diabetes management plan as needed. Write down: ? Food that you eat before and after you exercise. ? Blood glucose levels before and after you exercise. ? The type and amount of exercise you have done. ? When your insulin is expected to peak, if you use   insulin. Avoid exercising at times when your insulin is peaking.  When you start a new exercise or activity, work with your health care provider to make sure the activity is safe for you, and to adjust your insulin, medicines, or food intake as needed.  Drink plenty of water while  you exercise to prevent dehydration or heat stroke. Drink enough fluid to keep your urine clear or pale yellow. Summary  Exercising regularly is important for your overall health, especially when you have diabetes (diabetes mellitus).  Exercising has many health benefits, such as increasing muscle strength and bone density and reducing body fat and stress.  Your health care provider or certified diabetes educator can help you make a plan for the type and frequency of exercise (activity plan) that works for you.  When you start a new exercise or activity, work with your health care provider to make sure the activity is safe for you, and to adjust your insulin, medicines, or food intake as needed. This information is not intended to replace advice given to you by your health care provider. Make sure you discuss any questions you have with your health care provider. Document Released: 07/11/2003 Document Revised: 11/12/2016 Document Reviewed: 09/30/2015 Elsevier Patient Education  2020 [employer]  

## 2019-03-17 NOTE — Progress Notes (Signed)
Patient: Casey Rogers Male    DOB: April 16, 1986   33 y.o.   MRN: [id number] Visit Date: 03/17/2019  Today's Provider: Trinna Post, PA-C   Chief Complaint  Patient presents with  . Diabetes   Subjective:     HPI  Diabetes Mellitus Type II, Follow-up:   Lab Results  Component Value Date   HGBA1C 13.9 (H) 02/07/2019    Last seen for diabetes 1 months ago.  Management since then includes starting patient on Metformin 52m and referring to CCM. He is currently taking metformin XR 1000 mg BID.  He reports excellent compliance with treatment. He is not having side effects.  Current symptoms include increase appetite and have been unchanged. Home blood sugar records: trend: stable  Episodes of hypoglycemia? no   Current insulin regiment: Is not on insulin Most Recent Eye Exam: patient reports has a scheduled appt in 2021 Weight trend: stable Prior visit with dietician: No Current exercise: walking Current diet habits: well balanced  Pertinent Labs:    Component Value Date/Time   CHOL 186 02/07/2019 1553   TRIG 328 (H) 02/07/2019 1553   HDL 34 (L) 02/07/2019 1553   LDLCALC 97 02/07/2019 1553   CREATININE 0.93 02/07/2019 1553   CREATININE 1.45 (H) 09/15/2012 1607    Wt Readings from Last 3 Encounters:  03/17/19 212 lb 6.4 oz (96.3 kg)  02/14/19 207 lb (93.9 kg)  02/07/19 206 lb (93.4 kg)    ------------------------------------------------------------------------    No Known Allergies   Current Outpatient Medications:  .  Accu-Chek FastClix Lancets MISC, USE TO TEST BLOOD SUGAR ONCE IN THE MORNING FOR FASTING BLOOD SUGAR, Disp: , Rfl:  .  ACCU-CHEK GUIDE test strip, USE TO CHECK SUGAR DAILY, Disp: , Rfl:  .  Blood Glucose Monitoring Suppl (ACCU-CHEK GUIDE) w/Device KIT, USE TO CHECK SUGAR ONCE IN THE MORNING, Disp: , Rfl:  .  metFORMIN (GLUCOPHAGE XR) 500 MG 24 hr tablet, Take 1 pill in the morning x 1 wk. Take 1 pill 2x daily for 1 wk. Take 2  pills in the morning and 1 at night x 1 wk. Then take 2 pills twice daily onward., Disp: 180 tablet, Rfl: 0  Review of Systems  Constitutional: Negative.   Respiratory: Negative.   Genitourinary: Negative.   Neurological: Negative.     Social History   Tobacco Use  . Smoking status: Never Smoker  . Smokeless tobacco: Never Used  Substance Use Topics  . Alcohol use: Yes      Objective:   BP 122/82   Pulse 74   Temp (!) 97.1 F (36.2 C) (Oral)   Resp 16   Wt 212 lb 6.4 oz (96.3 kg)   BMI 35.35 kg/m  Vitals:   03/17/19 0855  BP: 122/82  Pulse: 74  Resp: 16  Temp: (!) 97.1 F (36.2 C)  TempSrc: Oral  Weight: 212 lb 6.4 oz (96.3 kg)  Body mass index is 35.35 kg/m.   Physical Exam   No results found for any visits on 03/17/19.     Assessment & Plan    1. Uncontrolled type 2 diabetes mellitus with hyperglycemia (HCC)  A1c improved to 11.1%. He has reduced the amount of sodas he is drinking and started exercising by walking and lifting weights. We will see him back in 2 months for diabetes follow up and medication adjustments.  - POCT HgB A1C  2. Need for vaccination against Streptococcus pneumoniae  The entirety of  the information documented in the History of Present Illness, Review of Systems and Physical Exam were personally obtained by me. Portions of this information were initially documented by Jennings Books, CMA and reviewed by me for thoroughness and accuracy.           Trinna Post, PA-C  [facility]

## 2019-03-17 NOTE — Addendum Note (Signed)
Addended by: Minette Headland on: 03/17/2019 09:51 AM   Modules accepted: Orders

## 2019-05-19 ENCOUNTER — Ambulatory Visit: Payer: Self-pay | Admitting: Physician Assistant
